# Patient Record
Sex: Male | Born: 1990 | Race: White | Hispanic: No | Marital: Single | Smoking: Never smoker
Health system: Southern US, Community
[De-identification: ages and names within clinical notes are randomized; demographics above are authoritative.]

## PROBLEM LIST (undated history)

## (undated) DIAGNOSIS — D6851 Activated protein C resistance: Secondary | ICD-10-CM

---
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
## 2015-10-11 ENCOUNTER — Emergency Department (HOSPITAL_BASED_OUTPATIENT_CLINIC_OR_DEPARTMENT_OTHER)
Admission: EM | Admit: 2015-10-11 | Discharge: 2015-10-11 | Disposition: A | Discharge status: Home / Self Care | Payer: BLUE CROSS/BLUE SHIELD | Attending: Emergency Medicine | Admitting: Emergency Medicine

## 2015-10-11 ENCOUNTER — Encounter (HOSPITAL_BASED_OUTPATIENT_CLINIC_OR_DEPARTMENT_OTHER): Payer: Self-pay | Admitting: *Deleted

## 2015-10-11 DIAGNOSIS — K5900 Constipation, unspecified: Secondary | ICD-10-CM | POA: Insufficient documentation

## 2015-10-11 DIAGNOSIS — R112 Nausea with vomiting, unspecified: Secondary | ICD-10-CM

## 2015-10-11 DIAGNOSIS — R1084 Generalized abdominal pain: Secondary | ICD-10-CM | POA: Diagnosis not present

## 2015-10-11 DIAGNOSIS — J029 Acute pharyngitis, unspecified: Secondary | ICD-10-CM | POA: Insufficient documentation

## 2015-10-11 LAB — COMPREHENSIVE METABOLIC PANEL
ALT: 20 U/L (ref 17–63)
AST: 20 U/L (ref 15–41)
Albumin: 5.3 g/dL — ABNORMAL HIGH (ref 3.5–5.0)
Alkaline Phosphatase: 101 U/L (ref 38–126)
Anion gap: 13 (ref 5–15)
BILIRUBIN TOTAL: 1.2 mg/dL (ref 0.3–1.2)
BUN: 21 mg/dL — ABNORMAL HIGH (ref 6–20)
CHLORIDE: 104 mmol/L (ref 101–111)
CO2: 20 mmol/L — ABNORMAL LOW (ref 22–32)
CREATININE: 1.37 mg/dL — AB (ref 0.61–1.24)
Calcium: 10.1 mg/dL (ref 8.9–10.3)
Glucose, Bld: 153 mg/dL — ABNORMAL HIGH (ref 65–99)
POTASSIUM: 3.6 mmol/L (ref 3.5–5.1)
Sodium: 137 mmol/L (ref 135–145)
TOTAL PROTEIN: 8.6 g/dL — AB (ref 6.5–8.1)

## 2015-10-11 LAB — CBC WITH DIFFERENTIAL/PLATELET
Basophils Absolute: 0 10*3/uL (ref 0.0–0.1)
Basophils Relative: 0 %
EOS PCT: 0 %
Eosinophils Absolute: 0 10*3/uL (ref 0.0–0.7)
HCT: 46.7 % (ref 39.0–52.0)
Hemoglobin: 16.8 g/dL (ref 13.0–17.0)
LYMPHS ABS: 0.8 10*3/uL (ref 0.7–4.0)
LYMPHS PCT: 6 %
MCH: 31.3 pg (ref 26.0–34.0)
MCHC: 36 g/dL (ref 30.0–36.0)
MCV: 87.1 fL (ref 78.0–100.0)
MONO ABS: 1.2 10*3/uL — AB (ref 0.1–1.0)
Monocytes Relative: 9 %
Neutro Abs: 11.6 10*3/uL — ABNORMAL HIGH (ref 1.7–7.7)
Neutrophils Relative %: 85 %
PLATELETS: 239 10*3/uL (ref 150–400)
RBC: 5.36 MIL/uL (ref 4.22–5.81)
RDW: 11.7 % (ref 11.5–15.5)
WBC: 13.5 10*3/uL — ABNORMAL HIGH (ref 4.0–10.5)

## 2015-10-11 LAB — LIPASE, BLOOD: Lipase: 12 U/L (ref 11–51)

## 2015-10-11 MED ORDER — ONDANSETRON 4 MG PO TBDP: 4.0000 mg | ORAL_TABLET | Freq: Three times a day (TID) | ORAL | Status: AC | PRN

## 2015-10-11 MED ORDER — SODIUM CHLORIDE 0.9 % IV BOLUS (SEPSIS)
1000.0000 mL | Freq: Once | INTRAVENOUS | Status: AC
  Administered 2015-10-11: 1000 mL via INTRAVENOUS

## 2015-10-11 MED ORDER — SODIUM CHLORIDE 0.9 % IV BOLUS (SEPSIS)
1000.0000 mL | Freq: Once | INTRAVENOUS | Status: AC
Start: 2015-10-11 — End: 2015-10-11
  Administered 2015-10-11: 1000 mL via INTRAVENOUS

## 2015-10-11 MED ORDER — ONDANSETRON HCL 4 MG/2ML IJ SOLN
4.0000 mg | Freq: Once | INTRAMUSCULAR | Status: AC
  Administered 2015-10-11: 4 mg via INTRAVENOUS
  Filled 2015-10-11: qty 2

## 2015-10-11 NOTE — Discharge Instructions (Signed)
Please read and follow all provided instructions.  Your diagnoses today include:  1. Non-intractable vomiting with nausea, vomiting of unspecified type   2. Generalized abdominal pain     Tests performed today include:  Blood counts and electrolytes - suggests dehydration  Blood tests to check liver and kidney function - liver is normal  Blood tests to check pancreas function - normal  Vital signs. See below for your results today.   Medications prescribed:   Zofran (ondansetron) - for nausea and vomiting  Take any prescribed medications only as directed.  Home care instructions:   Follow any educational materials contained in this packet.   Your abdominal pain, nausea, vomiting, and diarrhea may be caused by a viral gastroenteritis also called 'stomach flu'. You should rest for the next several days. Keep drinking plenty of fluids and use the medicine for nausea as directed.    Drink clear liquids for the next 12-24 hours and introduce solid foods slowly after 24 hours using the b.r.a.t. diet (Bananas, Rice, Applesauce, Toast, Yogurt).    Follow-up instructions: Please follow-up with your primary care provider in the next 2 days for further evaluation of your symptoms. If you are not feeling better in 48 hours you may have a condition that is more serious and you need re-evaluation.   Return instructions:  SEEK IMMEDIATE MEDICAL ATTENTION IF:  If you have pain that does not go away or becomes severe   A temperature above 101F develops   Repeated vomiting occurs (multiple episodes)   If you have pain that becomes localized to portions of the abdomen. The right side could possibly be appendicitis. In an adult, the left lower portion of the abdomen could be colitis or diverticulitis.   Blood is being passed in stools or vomit (bright red or black tarry stools)   You develop chest pain, difficulty breathing, dizziness or fainting, or become confused, poorly responsive, or  inconsolable (young children)  If you have any other emergent concerns regarding your health  Additional Information: Abdominal (belly) pain can be caused by many things. Your caregiver performed an examination and possibly ordered blood/urine tests and imaging (CT scan, x-rays, ultrasound). Many cases can be observed and treated at home after initial evaluation in the emergency department. Even though you are being discharged home, abdominal pain can be unpredictable. Therefore, you need a repeated exam if your pain does not resolve, returns, or worsens. Most patients with abdominal pain don't have to be admitted to the hospital or have surgery, but serious problems like appendicitis and gallbladder attacks can start out as nonspecific pain. Many abdominal conditions cannot be diagnosed in one visit, so follow-up evaluations are very important.  Your vital signs today were: BP 135/69 mmHg   Pulse 94   Temp(Src) 97.9 F (36.6 C) (Oral)   Resp 18   Ht 5\' 11"  (1.803 m)   Wt 90.719 kg   BMI 27.91 kg/m2   SpO2 98% If your blood pressure (bp) was elevated above 135/85 this visit, please have this repeated by your doctor within one month. --------------

## 2015-10-11 NOTE — ED Notes (Signed)
Provider at bedside

## 2015-10-11 NOTE — ED Notes (Signed)
Vomiting since this am. Abdominal pain.  

## 2015-10-11 NOTE — ED Provider Notes (Signed)
CSN: [id number]     Arrival date & time 10/11/15  2033 History  By signing my name below, I, [facility]arrissa Washington, attest that this documentation has been prepared under the direction and in the presence of RaytheonJosh Kiosha Buchan PA-C. Electronically Signed: Randell PatientMarrissa Washington, ED Scribe. 10/11/2015. 9:43 PM.  Chief Complaint  Patient presents with  . Emesis    The history is provided by the patient. No language interpreter was used.   HPI Comments: Malena PeerJacob Larsen is a 25 y.o. male with no pertinent chronic conditions who presents to the Emergency Department complaining of intermittent, moderate emesis onset this morning. Pt states that he was coughing up phlegm and complained of a sore throat last night followed by nausea, emesis, and generalized abdominal pain earlier today. He reports associated constipation. He has not been able to eat or drink secondary to emesis. Abdominal pain is unchanged by eating. Per pt[place] notes that he did not enter or drink the water. Denies hx of appendectomy, cholecystectomy, hernias, or other abdominal surgeries. Denies heavy ETOH consumption or NSAID use. Denies recent sick contact. Denies fever or dysuria.   History reviewed. No pertinent past medical history. History reviewed. No pertinent past surgical history. No family history on file. Social History  Substance Use Topics  . Smoking status: Never Smoker   . Smokeless tobacco: None  . Alcohol Use: No    Review of Systems  Constitutional: Negative for fever and appetite change.  HENT: Positive for sore throat. Negative for rhinorrhea.   Eyes: Negative for redness.  Respiratory: Positive for cough.   Cardiovascular: Negative for chest pain.  Gastrointestinal: Positive for nausea, vomiting, abdominal pain and constipation. Negative for diarrhea and blood in stool.       Negative for hematemesis  Genitourinary: Negative for dysuria.  Musculoskeletal: Negative for myalgias.   Skin: Negative for rash.  Neurological: Negative for light-headedness.    Allergies  Review of patient's allergies indicates no known allergies.  Home Medications   Prior to Admission medications   Not on File   BP 135/69 mmHg  Pulse 94  Temp(Src) 97.9 F (36.6 C) (Oral)  Resp 18  Ht 5\' 11"  (1.803 m)  Wt 200 lb (90.719 kg)  BMI 27.91 kg/m2  SpO2 98%   Physical Exam  Constitutional: He is oriented to person, place, and time. He appears well-developed and well-nourished. No distress.  HENT:  Head: Normocephalic and atraumatic.  Right Ear: Tympanic membrane, external ear and ear canal normal.  Left Ear: Tympanic membrane, external ear and ear canal normal.  Nose: Nose normal. No mucosal edema.  Mouth/Throat: Oropharynx is clear and moist. No oropharyngeal exudate, posterior oropharyngeal edema or posterior oropharyngeal erythema.  Eyes: Conjunctivae are normal. Right eye exhibits no discharge. Left eye exhibits no discharge.  Neck: Normal range of motion. Neck supple.  Cardiovascular: Normal rate, regular rhythm and normal heart sounds.   Pulmonary/Chest: Effort normal and breath sounds normal. No respiratory distress. He has no wheezes. He has no rales.  Abdominal: Soft. There is tenderness. There is no rebound and no guarding.  Mild epigastric tenderness  Musculoskeletal: Normal range of motion.  Neurological: He is alert and oriented to person, place, and time.  Skin: Skin is warm and dry.  Psychiatric: He has a normal mood and affect. His behavior is normal.  Nursing note and vitals reviewed.   ED Course  Procedures (including critical care time)  DIAGNOSTIC STUDIES: Oxygen Saturation is 98%  on RA, normal by my interpretation.    COORDINATION OF CARE: 8:41 PM Will order Zofran, IV fluids, and labs. Will monitor in ED and return to check on pt. Discussed treatment plan with pt at bedside and pt agreed to plan.  9:51 PM Patient and family updated on results.  Mainly suggestive of dehydration. Will give second bag of IV fluids. Patient is attempting to drink liquids. He has had minor dry heaving but no vomiting. Will likely be d/c to home with zofran if tolerates liquids after 2nd bolus completed.   Labs Review Labs Reviewed  CBC WITH DIFFERENTIAL/PLATELET - Abnormal; Notable for the following:    WBC 13.5 (*)    Neutro Abs 11.6 (*)    Monocytes Absolute 1.2 (*)    All other components within normal limits  COMPREHENSIVE METABOLIC PANEL - Abnormal; Notable for the following:    CO2 20 (*)    Glucose, Bld 153 (*)    BUN 21 (*)    Creatinine, Ser 1.37 (*)    Total Protein 8.6 (*)    Albumin 5.3 (*)    All other components within normal limits  LIPASE, BLOOD   11:13 PM Patient continues to do well. Abd is non-tender. Patient drinking fluids in the room. Will discharge home Zofran.  The patient was urged to return to the Emergency Department immediately with worsening of current symptoms, worsening abdominal pain, persistent vomiting, blood noted in stools, fever, or any other concerns. The patient verbalized understanding.   MDM   Final diagnoses:  Non-intractable vomiting with nausea, vomiting of unspecified type  Generalized abdominal pain   Patient with abdominal pain. Vitals are stable, no fever. Labs suggestive of dehydration. Leukocytosis noted. Imaging not felt indicated with current exam and history. Patient treated with 2L NS for dehydration. Now tolerating fluids. Lungs are clear and no signs suggestive of PNA. Low concern for appendicitis, cholecystitis, pancreatitis, ruptured viscus, UTI, kidney stone, aortic dissection, aortic aneurysm or other emergent abdominal etiology. Supportive therapy indicated with return if symptoms worsen.   I personally performed the services described in this documentation, which was scribed in my presence. The recorded information has been reviewed and is accurate.    Renne CriglerJoshua Gwynn Chalker, PA-C 10/11/15  2316  Lyndal Pulleyaniel Knott, MD 10/12/15 [phone]

## 2018-07-25 ENCOUNTER — Emergency Department (HOSPITAL_COMMUNITY)
Admission: EM | Admit: 2018-07-25 | Discharge: 2018-07-25 | Discharge status: Against Medical Advice | Payer: 59 | Attending: Emergency Medicine | Admitting: Emergency Medicine

## 2018-07-25 ENCOUNTER — Other Ambulatory Visit: Payer: Self-pay

## 2018-07-25 ENCOUNTER — Encounter (HOSPITAL_COMMUNITY): Payer: Self-pay | Admitting: Emergency Medicine

## 2018-07-25 ENCOUNTER — Emergency Department (HOSPITAL_COMMUNITY): Payer: 59

## 2018-07-25 DIAGNOSIS — Z5321 Procedure and treatment not carried out due to patient leaving prior to being seen by health care provider: Secondary | ICD-10-CM | POA: Insufficient documentation

## 2018-07-25 DIAGNOSIS — R0602 Shortness of breath: Secondary | ICD-10-CM | POA: Diagnosis present

## 2018-07-25 LAB — CBC
HCT: 46.8 % (ref 39.0–52.0)
Hemoglobin: 15.8 g/dL (ref 13.0–17.0)
MCH: 30.8 pg (ref 26.0–34.0)
MCHC: 33.8 g/dL (ref 30.0–36.0)
MCV: 91.2 fL (ref 80.0–100.0)
Platelets: 203 10*3/uL (ref 150–400)
RBC: 5.13 MIL/uL (ref 4.22–5.81)
RDW: 11.1 % — ABNORMAL LOW (ref 11.5–15.5)
WBC: 8.5 10*3/uL (ref 4.0–10.5)
nRBC: 0 % (ref 0.0–0.2)

## 2018-07-25 LAB — COMPREHENSIVE METABOLIC PANEL
ALT: 43 U/L (ref 0–44)
AST: 26 U/L (ref 15–41)
Albumin: 4.2 g/dL (ref 3.5–5.0)
Alkaline Phosphatase: 81 U/L (ref 38–126)
Anion gap: 11 (ref 5–15)
BUN: 18 mg/dL (ref 6–20)
CO2: 22 mmol/L (ref 22–32)
Calcium: 9.6 mg/dL (ref 8.9–10.3)
Chloride: 104 mmol/L (ref 98–111)
Creatinine, Ser: 1.16 mg/dL (ref 0.61–1.24)
GFR calc Af Amer: >60 mL/min (ref >60)
GFR calc non Af Amer: >60 mL/min (ref >60)
Glucose, Bld: 107 mg/dL — ABNORMAL HIGH (ref 70–99)
Potassium: 3.9 mmol/L (ref 3.5–5.1)
Sodium: 137 mmol/L (ref 135–145)
Total Bilirubin: 0.4 mg/dL (ref 0.3–1.2)
Total Protein: 7.4 g/dL (ref 6.5–8.1)

## 2018-07-25 NOTE — ED Triage Notes (Signed)
Per pt, was diagnosed w/ "a blood clot in left leg" was sent home w/ a prescription for blood thinners which he did not have time to fill.  Pt was told to come to ED if he became SOB, which he does.  No pain in chest but feels "strange."

## 2018-07-25 NOTE — ED Notes (Signed)
Pt up to sort desk stating he was leaving. Pt says he is not waiting any longer. Encouraged patient to stay for evaluation by EDP, pt says he is leaving. Encouraged to return if he changes his mind.

## 2018-07-25 NOTE — ED Notes (Signed)
Pt Mom- [phone], pt mother would like to be contacted after disposition is decided.

## 2018-07-29 ENCOUNTER — Other Ambulatory Visit: Payer: Self-pay

## 2018-07-30 ENCOUNTER — Telehealth: Payer: Self-pay

## 2018-07-30 NOTE — Telephone Encounter (Signed)
rec'vd a answering service message from [facility] Re: [name], wanting an appt due to acute DVT L LE . Called Ms. Burns and explained that being that we have never seen the pt I would not be able to give much medical advice but did advise her to contact Dr. Edmon Crape with her concerns and if he felt like he needed to be seen by vascular to send a referral. She verbalized understanding and agreed with the plan

## 2020-01-16 IMAGING — CR CHEST - 2 VIEW
2 series · 2 of 2 positions shown · non-contrast
Comparison: None.

CLINICAL DATA: Shortness of breath.

EXAM:
CHEST - 2 VIEW

[chest pa]
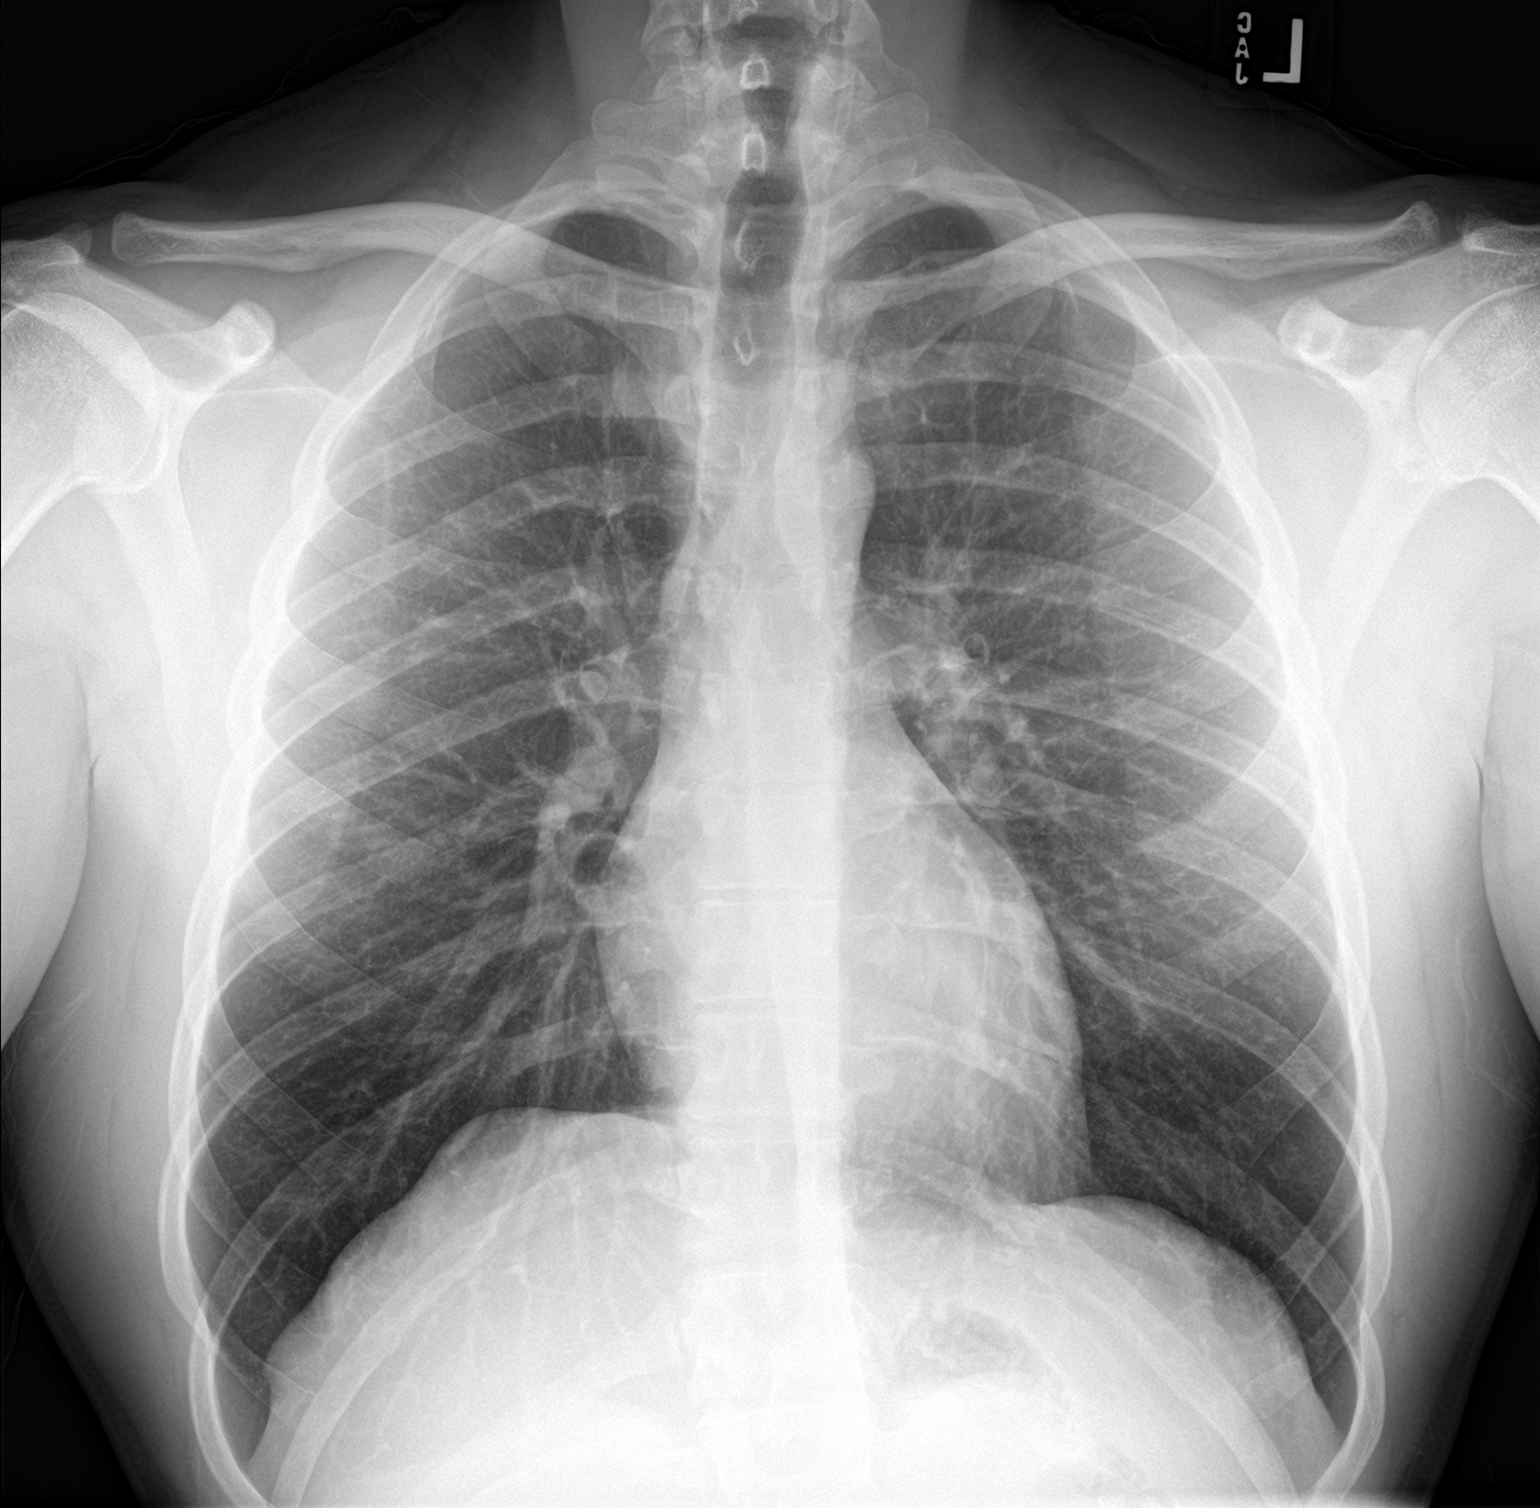

[chest lat]
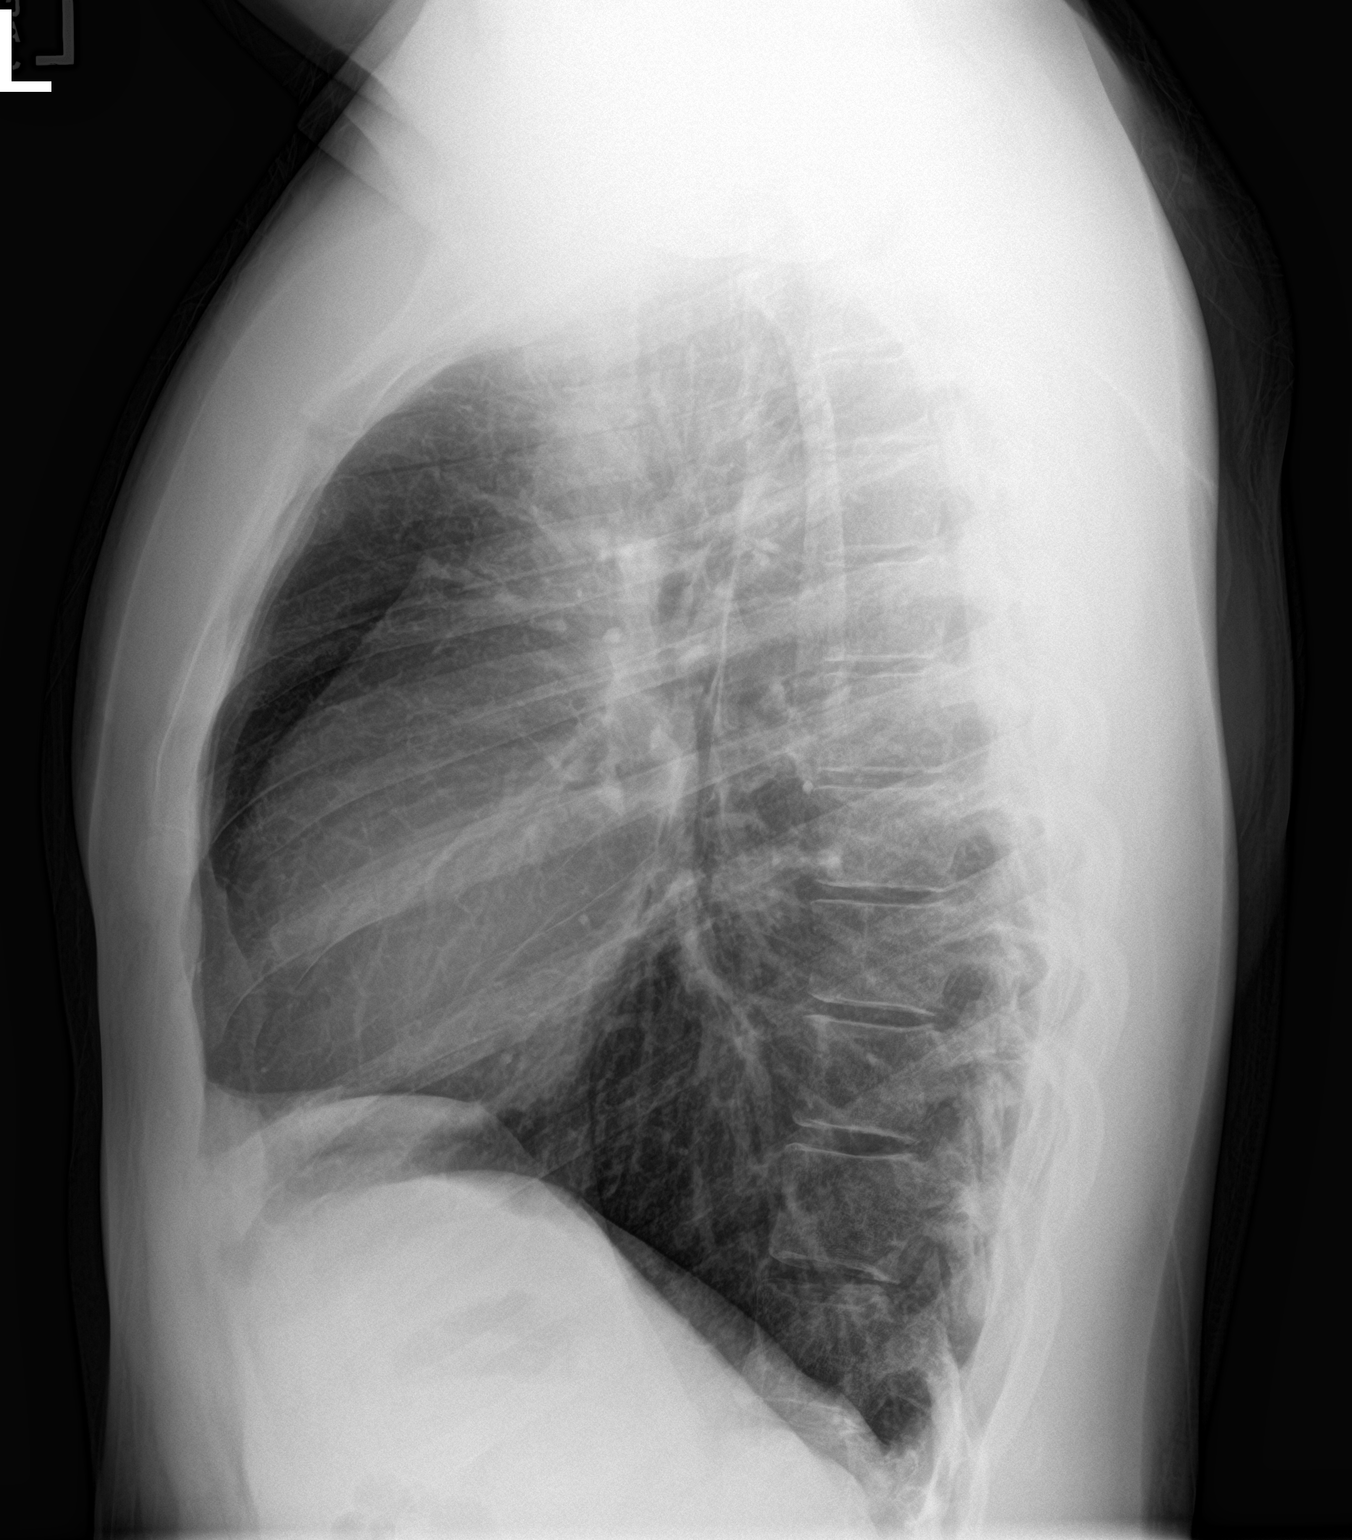

[2 of 2 positions shown; findings below may reference images not displayed]

FINDINGS: The heart size and mediastinal contours are within normal limits.
Both lungs are clear. The visualized skeletal structures are
unremarkable.
IMPRESSION: No active cardiopulmonary disease.

## 2023-09-19 ENCOUNTER — Emergency Department (HOSPITAL_BASED_OUTPATIENT_CLINIC_OR_DEPARTMENT_OTHER)

## 2023-09-19 ENCOUNTER — Other Ambulatory Visit: Payer: Self-pay

## 2023-09-19 ENCOUNTER — Emergency Department (HOSPITAL_BASED_OUTPATIENT_CLINIC_OR_DEPARTMENT_OTHER)
Admission: EM | Admit: 2023-09-19 | Discharge: 2023-09-19 | Disposition: A | Discharge status: Home / Self Care | Attending: Emergency Medicine | Admitting: Emergency Medicine

## 2023-09-19 ENCOUNTER — Encounter (HOSPITAL_BASED_OUTPATIENT_CLINIC_OR_DEPARTMENT_OTHER): Payer: Self-pay

## 2023-09-19 DIAGNOSIS — R519 Headache, unspecified: Secondary | ICD-10-CM | POA: Insufficient documentation

## 2023-09-19 DIAGNOSIS — H538 Other visual disturbances: Secondary | ICD-10-CM | POA: Diagnosis not present

## 2023-09-19 DIAGNOSIS — M542 Cervicalgia: Secondary | ICD-10-CM | POA: Diagnosis not present

## 2023-09-19 HISTORY — DX: Activated protein C resistance: D68.51

## 2023-09-19 LAB — CBC WITH DIFFERENTIAL/PLATELET
Abs Immature Granulocytes: 0.01 10*3/uL (ref 0.00–0.07)
Basophils Absolute: 0 10*3/uL (ref 0.0–0.1)
Basophils Relative: 1 %
Eosinophils Absolute: 0.1 10*3/uL (ref 0.0–0.5)
Eosinophils Relative: 1 %
HCT: 43 % (ref 39.0–52.0)
Hemoglobin: 14.9 g/dL (ref 13.0–17.0)
Immature Granulocytes: 0 %
Lymphocytes Relative: 39 %
Lymphs Abs: 1.8 10*3/uL (ref 0.7–4.0)
MCH: 30.5 pg (ref 26.0–34.0)
MCHC: 34.7 g/dL (ref 30.0–36.0)
MCV: 88.1 fL (ref 80.0–100.0)
Monocytes Absolute: 0.5 10*3/uL (ref 0.1–1.0)
Monocytes Relative: 10 %
Neutro Abs: 2.2 10*3/uL (ref 1.7–7.7)
Neutrophils Relative %: 49 %
Platelets: 244 10*3/uL (ref 150–400)
RBC: 4.88 MIL/uL (ref 4.22–5.81)
RDW: 12 % (ref 11.5–15.5)
WBC: 4.6 10*3/uL (ref 4.0–10.5)
nRBC: 0 % (ref 0.0–0.2)

## 2023-09-19 LAB — COMPREHENSIVE METABOLIC PANEL WITH GFR
ALT: 15 U/L (ref 0–44)
AST: 21 U/L (ref 15–41)
Albumin: 5 g/dL (ref 3.5–5.0)
Alkaline Phosphatase: 66 U/L (ref 38–126)
Anion gap: 12 (ref 5–15)
BUN: 18 mg/dL (ref 6–20)
CO2: 25 mmol/L (ref 22–32)
Calcium: 9.6 mg/dL (ref 8.9–10.3)
Chloride: 103 mmol/L (ref 98–111)
Creatinine, Ser: 1.02 mg/dL (ref 0.61–1.24)
GFR, Estimated: >60 mL/min (ref >60)
Glucose, Bld: 123 mg/dL — ABNORMAL HIGH (ref 70–99)
Potassium: 3.6 mmol/L (ref 3.5–5.1)
Sodium: 141 mmol/L (ref 135–145)
Total Bilirubin: 0.3 mg/dL (ref 0.0–1.2)
Total Protein: 7.2 g/dL (ref 6.5–8.1)

## 2023-09-19 LAB — C-REACTIVE PROTEIN: CRP: <0.5 mg/dL (ref <1.0)

## 2023-09-19 LAB — CBG MONITORING, ED: Glucose-Capillary: 179 mg/dL — ABNORMAL HIGH (ref 70–99)

## 2023-09-19 LAB — SEDIMENTATION RATE: Sed Rate: 1 mm/h (ref 0–16)

## 2023-09-19 MED ORDER — IOHEXOL 350 MG/ML SOLN
75.0000 mL | Freq: Once | INTRAVENOUS | Status: AC | PRN
Start: 2023-09-19 — End: 2023-09-19
  Administered 2023-09-19: 75 mL via INTRAVENOUS

## 2023-09-19 NOTE — ED Notes (Signed)
 Patient transported to CT

## 2023-09-19 NOTE — ED Triage Notes (Signed)
 Pt states that he has been having pain in his temple in the past few months and then in the past month started having blurry vision. Pt saw his eye doctor this morning and was advised to come here to have a blood clot in his brain ruled out. Pt does have a history of blood clotting disorder. Hx of DVT in left leg. No hx of stroke.   Anastacio Balm, RN

## 2023-09-19 NOTE — Discharge Instructions (Signed)
 Make an appointment follow-up with your regular doctor as well as [place] neurology.  MRI may be indicated for further evaluation.  Workup here today reassuring.  Return for any new or worse symptoms.

## 2023-09-19 NOTE — ED Provider Notes (Signed)
 C-reactive protein still pending.  But that takes a while to come back.  Patient's sed rate was normal.  CT angio head and neck without any acute findings.  And CT venogram without any acute findings.  Patient stable for discharge home based on the duration of symptoms and follow-up with primary care provider for outpatient MRI.   Talbot Monarch, MD 09/19/23 1701

## 2023-09-19 NOTE — ED Provider Notes (Signed)
 [name] Provider Note   CSN: [id number] Arrival date & time: 09/19/23  1137     Patient presents with: Blurred Vision   [name] is a 33 y.o. male.   HPI 33 year old male with a history of factor V Leiden who is no longer on anticoagulation presents with headache and eye symptoms.  For the past couple months he has been having intermittent pain near his temple.  Nothing specific makes it come on and it seems to be random.  He is also had blurry vision out of the right eye during the same 2 months, is not affected by whether or not he is having pain.  There are no left eye symptoms.  The patient has also been having intermittent but now more constant pain in his right lateral neck as well.  Went to an eye doctor this morning who evaluated him and said his eye was fine and advised him to come to the ED for evaluation.  Prior to Admission medications   Medication Sig Start Date End Date Taking? Authorizing Provider  ondansetron  (ZOFRAN  ODT) 4 MG disintegrating tablet Take 1 tablet (4 mg total) by mouth every 8 (eight) hours as needed for nausea or vomiting. 10/11/15   Lyna Sandhoff, PA-C    Allergies: Patient has no known allergies.    Review of Systems  Constitutional:  Negative for fever.  Eyes:  Positive for visual disturbance. Negative for pain.  Musculoskeletal:  Positive for neck pain.  Neurological:  Positive for headaches. Negative for weakness and numbness.    Updated Vital Signs BP 125/76   Pulse 74   Temp 98.2 F (36.8 C)   Resp 15   Ht 5' 10 (1.778 m)   Wt 76.2 kg   SpO2 98%   BMI 24.11 kg/m   Physical Exam Vitals and nursing note reviewed.  Constitutional:      Appearance: He is well-developed.  HENT:     Head: Normocephalic and atraumatic.    Eyes:     Extraocular Movements: Extraocular movements intact.     Pupils: Pupils are equal, round, and reactive to light.    Cardiovascular:     Rate and  Rhythm: Normal rate and regular rhythm.     Heart sounds: Normal heart sounds.  Pulmonary:     Effort: Pulmonary effort is normal.     Breath sounds: Normal breath sounds.  Abdominal:     Palpations: Abdomen is soft.     Tenderness: There is no abdominal tenderness.   Musculoskeletal:     Cervical back: No rigidity.   Skin:    General: Skin is warm and dry.   Neurological:     Mental Status: He is alert.     Comments: CN 3-12 grossly intact. 5/5 strength in all 4 extremities. Grossly normal sensation. Normal finger to nose.     (all labs ordered are listed, but only abnormal results are displayed) Labs Reviewed  COMPREHENSIVE METABOLIC PANEL WITH GFR - Abnormal; Notable for the following components:      Result Value   Glucose, Bld 123 (*)    All other components within normal limits  CBG MONITORING, ED - Abnormal; Notable for the following components:   Glucose-Capillary 179 (*)    All other components within normal limits  CBC WITH DIFFERENTIAL/PLATELET  SEDIMENTATION RATE  C-REACTIVE PROTEIN    EKG: EKG Interpretation Date/Time:  Thursday September 19 2023 11:51:08 EDT Ventricular Rate:  67 PR Interval:  139 QRS Duration:  104 QT Interval:  371 QTC Calculation: 392 R Axis:   82  Text Interpretation: Sinus rhythm ST elev, probable normal early repol pattern No old tracing to compare Confirmed by Jerilynn Montenegro [phone]) on 09/19/2023 11:52:47 AM  Radiology: No results found.   Procedures   Medications Ordered in the ED  iohexol (OMNIPAQUE) 350 MG/ML injection 75 mL (75 mLs Intravenous Contrast Given 09/19/23 1335)                                    Medical Decision Making Amount and/or Complexity of Data Reviewed Labs: ordered.    Details: Mild elevated glucose.  Normal WBC.  Normal ESR. Radiology: ordered. ECG/medicine tests: independent interpretation performed.    Details: No ischemia.  Risk Prescription drug management.   Patient presents with  weeks of on and off headache and right eye vision problems.  Exam is grossly unremarkable.  CTA and CTV were obtained. Care transferred to Dr. Zackowski.     Final diagnoses:  None    ED Discharge Orders     None          Jerilynn Montenegro, MD 09/19/23 1510
